# Patient Record
Sex: Female | Born: 1960 | Race: Black or African American | Hispanic: No | Marital: Married | Smoking: Never smoker
Health system: Southern US, Community
[De-identification: ages and names within clinical notes are randomized; demographics above are authoritative.]

## PROBLEM LIST (undated history)

## (undated) DIAGNOSIS — I10 Essential (primary) hypertension: Secondary | ICD-10-CM

## (undated) HISTORY — PX: TUBAL LIGATION: SHX77

## (undated) HISTORY — DX: Essential (primary) hypertension: I10

---

## 2004-09-04 ENCOUNTER — Ambulatory Visit (HOSPITAL_COMMUNITY): Admission: RE | Admit: 2004-09-04 | Discharge: 2004-09-04 | Payer: Self-pay | Admitting: Obstetrics

## 2008-02-11 ENCOUNTER — Ambulatory Visit (HOSPITAL_COMMUNITY): Admission: RE | Admit: 2008-02-11 | Discharge: 2008-02-11 | Payer: Self-pay | Admitting: Obstetrics

## 2008-10-03 ENCOUNTER — Ambulatory Visit (HOSPITAL_COMMUNITY): Admission: RE | Admit: 2008-10-03 | Discharge: 2008-10-03 | Payer: Self-pay | Admitting: Internal Medicine

## 2009-10-10 ENCOUNTER — Ambulatory Visit (HOSPITAL_COMMUNITY): Admission: RE | Admit: 2009-10-10 | Discharge: 2009-10-10 | Payer: Self-pay | Admitting: Internal Medicine

## 2010-08-12 ENCOUNTER — Encounter: Payer: Self-pay | Admitting: Internal Medicine

## 2011-02-02 ENCOUNTER — Emergency Department (HOSPITAL_COMMUNITY)
Admission: EM | Admit: 2011-02-02 | Discharge: 2011-02-03 | Disposition: A | Discharge status: Home / Self Care | Payer: No Typology Code available for payment source | Attending: Emergency Medicine | Admitting: Emergency Medicine

## 2011-02-02 DIAGNOSIS — I10 Essential (primary) hypertension: Secondary | ICD-10-CM | POA: Insufficient documentation

## 2011-02-02 DIAGNOSIS — R51 Headache: Secondary | ICD-10-CM | POA: Insufficient documentation

## 2011-02-03 ENCOUNTER — Emergency Department (HOSPITAL_COMMUNITY): Payer: No Typology Code available for payment source

## 2011-04-11 ENCOUNTER — Other Ambulatory Visit (HOSPITAL_COMMUNITY): Payer: Self-pay | Admitting: Internal Medicine

## 2011-04-11 DIAGNOSIS — Z1231 Encounter for screening mammogram for malignant neoplasm of breast: Secondary | ICD-10-CM

## 2011-04-30 ENCOUNTER — Ambulatory Visit (HOSPITAL_COMMUNITY)
Admission: RE | Admit: 2011-04-30 | Discharge: 2011-04-30 | Disposition: A | Discharge status: Home / Self Care | Payer: BC Managed Care – PPO | Source: Ambulatory Visit | Attending: Internal Medicine | Admitting: Internal Medicine

## 2011-04-30 DIAGNOSIS — Z1231 Encounter for screening mammogram for malignant neoplasm of breast: Secondary | ICD-10-CM | POA: Insufficient documentation

## 2013-03-25 ENCOUNTER — Encounter: Payer: BC Managed Care – PPO | Attending: Internal Medicine | Admitting: *Deleted

## 2013-03-25 ENCOUNTER — Encounter: Payer: Self-pay | Admitting: *Deleted

## 2013-03-25 DIAGNOSIS — E669 Obesity, unspecified: Secondary | ICD-10-CM | POA: Insufficient documentation

## 2013-03-25 DIAGNOSIS — E663 Overweight: Secondary | ICD-10-CM | POA: Insufficient documentation

## 2013-03-25 DIAGNOSIS — Z713 Dietary counseling and surveillance: Secondary | ICD-10-CM | POA: Insufficient documentation

## 2013-03-25 NOTE — Progress Notes (Signed)
 Nutrition Therapy:  Appt start time: 0800 end time:  0900.  Assessment:  Primary concern today: Weight management. Patient expresses a desire to lose weight. She is committed to making small changes for realistic, sustained weight loss. She would like to lose 30 pounds initially (to 200 pounds), then eventually get down to 160-180 pounds. She has been making an effort to reduce portion size (eating half of meals when eating out), not eating after 7:30pm, unless it is a healthy snack, and exercise more. She has lost about 2 pounds in 2 weeks. She does admit that finding time to plan healthy meals and snacking are challenges for her. She does have a sedentary job, but reports that her coworkers are trying to get a lunch walking group together.  TANITA  BODY COMP RESULTS     BMI (kg/m^2) 34.2   Fat Mass (lbs) 105   Fat Free Mass (lbs) 126  % Fat 45.5    MEDICATIONS: Reviewed   DIETARY INTAKE:   Usual eating pattern includes 3 meals and 2-3 snacks per day.  24-hr recall:  B ( AM): Toaster streudal/Special K with almond milk OR grits, boiled egg, coffee (creamer) Snk ( AM): Yogurt  L ( PM): Salad OR Mexican/Chipotle 1/2 portions OR sandwich, yogurt, water Snk ( PM): Nuts, apple, yogurt D ( PM): Grilled chicken/hamburgers, pasta salad, vegetables (corn, broccoli, green beans, asparagus, etc), water/wine occasionally OR split entree with husband or appetizer if out Snk ( PM): Occasional, same as above Beverages: Sweet tea, water with lemon  Usual physical activity: Walks in neighborhood when not too hot 45-60 min, 3 days weekly  Estimated energy needs: 1500 calories 188 g carbohydrates 94 g protein 42 g fat  Progress Towards Goal(s):  In progress.   Nutritional Diagnosis:  [place]-3.3 Overweight/obesity As related to history of excessive energy intake.  As evidenced by BMI 34.2.    Intervention:  Nutrition counseling. We discussed strategies for weight loss, including balancing  nutrients (carbs, protein, fat), portion control, healthy snacks, and exercise.   Goals: 1. 1 pounds weight loss per week. Goal weight: 200 pounds.  2. Monitor portion size.  3. Use meal plan provided to balance nutrients at meals with adequate dietary variety.  4. Continue to make healthy choices for snacks.  5. Continue to exercise 45-60 minutes at least 3 days weekly. Work up to walking 5 days weekly.   Handouts given during visit include:  5 day 1500 calorie sample meal plan  Yellow meal plan card  Monitoring/Evaluation:  Dietary intake, exercise, and body weight in 1 month(s).

## 2013-04-25 ENCOUNTER — Encounter: Payer: BC Managed Care – PPO | Attending: Internal Medicine | Admitting: *Deleted

## 2013-04-25 ENCOUNTER — Encounter: Payer: Self-pay | Admitting: *Deleted

## 2013-04-25 DIAGNOSIS — E663 Overweight: Secondary | ICD-10-CM | POA: Insufficient documentation

## 2013-04-25 DIAGNOSIS — Z713 Dietary counseling and surveillance: Secondary | ICD-10-CM | POA: Insufficient documentation

## 2013-04-25 DIAGNOSIS — E669 Obesity, unspecified: Secondary | ICD-10-CM | POA: Insufficient documentation

## 2013-04-25 NOTE — Progress Notes (Signed)
Medical Nutrition Therapy:  Appt start time: 0830 end time:  0900.  Assessment:  Patient returns for a follow up visit for weight management. She reports that she has gotten off track with her diet and exercise. She states that she has a hard time with portion control and snacking on unhealthy foods (i.e. Sweets). She acknowledges that she has to work on changing her mindset about food. She has not been walking due to poor weather conditions.   MEDICATIONS: Reviewed   DIETARY INTAKE:   Usual eating pattern includes 3 meals and 2-3 snacks per day.  24-hr recall:  B ( AM): Special K with almond milk OR grits, boiled egg, coffee (creamer)  Snk ( AM): Yogurt  L ( PM): Salad OR Mexican/Chipotle 1/2 portions OR sandwich, yogurt, water  Snk ( PM): Nuts, apple, yogurt  D ( PM): Grilled chicken/hamburgers, pasta salad, vegetables (corn, broccoli, green beans, asparagus, etc), water/wine occasionally OR split entree with husband or appetizer if out  Snk ( PM): Occasional, same as above  Beverages: Water with lemon  Usual physical activity: None, but will walk 45-60 minutes when the weather is nice.   Estimated energy needs: 1500 calories  188 g carbohydrates  94 g protein  42 g fat  Progress Towards Goal(s):  Some progress.   Nutritional Diagnosis:  [place]-3.3 Overweight/obesity As related to history of excessive energy intake. As evidenced by BMI 34.5.    Intervention:  Nutrition counseling. We reviewed strategies for weight loss, including meal planning, portion control, healthy snacks, and exercise. We also discussed using intuitive eating practices to manage intake.   Goals: 1 1 pound weight loss per week. Goal weight by next visit: 226 pounds.  2. Use plate method to balance nutrients at meals/increase vegetable intake.  3. Include 2-3 healthy snacks daily if hungry between meals.  4. Utilize intuitive eating practices to determine when to eat/stop eating.  5. Continue to practice portion  control.  6. Walk at least 30-45 minutes 3-5 days weekly.   Monitoring/Evaluation:  Dietary intake, exercise, and body weight in 2 month(s).

## 2013-06-30 ENCOUNTER — Ambulatory Visit: Payer: BC Managed Care – PPO | Admitting: *Deleted

## 2013-07-29 ENCOUNTER — Encounter (INDEPENDENT_AMBULATORY_CARE_PROVIDER_SITE_OTHER): Payer: Self-pay

## 2013-07-29 ENCOUNTER — Encounter: Payer: BC Managed Care – PPO | Attending: Internal Medicine | Admitting: *Deleted

## 2013-07-29 ENCOUNTER — Encounter: Payer: Self-pay | Admitting: *Deleted

## 2013-07-29 DIAGNOSIS — E669 Obesity, unspecified: Secondary | ICD-10-CM | POA: Insufficient documentation

## 2013-07-29 DIAGNOSIS — Z713 Dietary counseling and surveillance: Secondary | ICD-10-CM | POA: Insufficient documentation

## 2013-07-29 DIAGNOSIS — Z6830 Body mass index (BMI) 30.0-30.9, adult: Secondary | ICD-10-CM | POA: Insufficient documentation

## 2013-07-29 NOTE — Progress Notes (Signed)
Medical Nutrition Therapy:  Appt start time: 0845 end time:  0915.  Assessment:  Patient here today for a weight management follow-up. She reports that the eating healthy during the holidays was a challenge for her. However, she reports showing restraint eating sweets brought in to work and eating fruits instead. She also limited portions at holiday meals to 1 plate only. Finally, she had small portions of sweets. She was able to maintain her weight during the holidays. She also reports that normal daily intake is improved. She has been drinking water with lemon all day long, which has helped her limit her snacking. She now chooses healthy snacks (yogurt, grapefruit/citrus fruit, nuts). She has also been using the plate method to limit portion size. She is also [facility], and plans on going with her husband (who she reports is more motivated and disciplined when it comes to exercise) 3-4 days weekly for 45-60 minutes. She is encouraged by her ability to maintain her weight and resist temptation during the holidays  MEDICATIONS: Reviewed   DIETARY INTAKE:   Usual eating pattern includes 3 meals and 1-2 snacks per day.  24-hr recall:  B ( AM): Special K with almond milk OR grits, boiled egg, coffee (creamer)  Snk ( AM): Yogurt  L ( PM): Salad OR Mexican/Chipotle 1/2 portions OR sandwich, yogurt, water  Snk ( PM): Nuts, apple, yogurt  D ( PM): Grilled chicken/hamburgers, pasta salad, vegetables (corn, broccoli, green beans, asparagus, etc), water/wine occasionally OR split entree with husband or appetizer if out  Snk ( PM): Rarely Beverages: Water with lemon  Usual physical activity: None currently  Estimated energy needs: 1500 calories  188 g carbohydrates  94 g protein  42 g fat  Progress Towards Goal(s):  Some progress.   Nutritional Diagnosis:  Overweight/obesity As related to history of excessive energy intake. As evidenced by  BMI >30.  Intervention:  Nutrition counseling. We reviewed strategies for weight loss, including meal planning, portion control, healthy snacks, and exercise. Patient's progress was praised, and she was encouraged to continue her efforts.   Goals:  1 1 pound weight loss per week.  2. Continue to use plate method to balance nutrients at meals/increase vegetable intake.  3. Include 2-3 healthy snacks daily if hungry between meals.  4. Continue to utilize intuitive eating practices to determine when to eat/stop eating.  5. Continue to practice portion control.  6. Exercise at least 30-45 minutes 3-5 days weekly.   Monitoring/Evaluation:  Dietary intake, exercise, and body weight prn.

## 2015-03-28 ENCOUNTER — Other Ambulatory Visit: Payer: Self-pay | Admitting: Obstetrics and Gynecology

## 2015-03-28 DIAGNOSIS — R928 Other abnormal and inconclusive findings on diagnostic imaging of breast: Secondary | ICD-10-CM

## 2015-04-03 ENCOUNTER — Ambulatory Visit
Admission: RE | Admit: 2015-04-03 | Discharge: 2015-04-03 | Disposition: A | Discharge status: Home / Self Care | Payer: BLUE CROSS/BLUE SHIELD | Source: Ambulatory Visit | Attending: Obstetrics and Gynecology | Admitting: Obstetrics and Gynecology

## 2015-04-03 ENCOUNTER — Ambulatory Visit
Admission: RE | Admit: 2015-04-03 | Discharge: 2015-04-03 | Disposition: A | Discharge status: Home / Self Care | Source: Ambulatory Visit | Attending: Obstetrics and Gynecology | Admitting: Obstetrics and Gynecology

## 2015-04-03 DIAGNOSIS — R928 Other abnormal and inconclusive findings on diagnostic imaging of breast: Secondary | ICD-10-CM

## 2020-02-28 ENCOUNTER — Other Ambulatory Visit: Payer: Self-pay | Admitting: Obstetrics and Gynecology

## 2020-02-28 DIAGNOSIS — R928 Other abnormal and inconclusive findings on diagnostic imaging of breast: Secondary | ICD-10-CM

## 2020-03-09 ENCOUNTER — Other Ambulatory Visit: Payer: Self-pay

## 2020-03-09 ENCOUNTER — Ambulatory Visit
Admission: RE | Admit: 2020-03-09 | Discharge: 2020-03-09 | Disposition: A | Discharge status: Home / Self Care | Payer: BLUE CROSS/BLUE SHIELD | Source: Ambulatory Visit | Attending: Obstetrics and Gynecology | Admitting: Obstetrics and Gynecology

## 2020-03-09 ENCOUNTER — Ambulatory Visit
Admission: RE | Admit: 2020-03-09 | Discharge: 2020-03-09 | Disposition: A | Discharge status: Home / Self Care | Payer: No Typology Code available for payment source | Source: Ambulatory Visit | Attending: Obstetrics and Gynecology | Admitting: Obstetrics and Gynecology

## 2020-03-09 DIAGNOSIS — R928 Other abnormal and inconclusive findings on diagnostic imaging of breast: Secondary | ICD-10-CM

## 2021-01-18 IMAGING — MG MM DIGITAL DIAGNOSTIC UNILAT*R* W/ TOMO W/ CAD
4 series · 4 of 12 positions shown · non-contrast
Comparison: Previous exam(s).

CLINICAL DATA: Patient was recalled from screening mammogram for a
possible right breast asymmetry

EXAM:
DIGITAL DIAGNOSTIC RIGHT MAMMOGRAM WITH CAD AND TOMO
ULTRASOUND RIGHT BREAST

[R ML synth-2D]
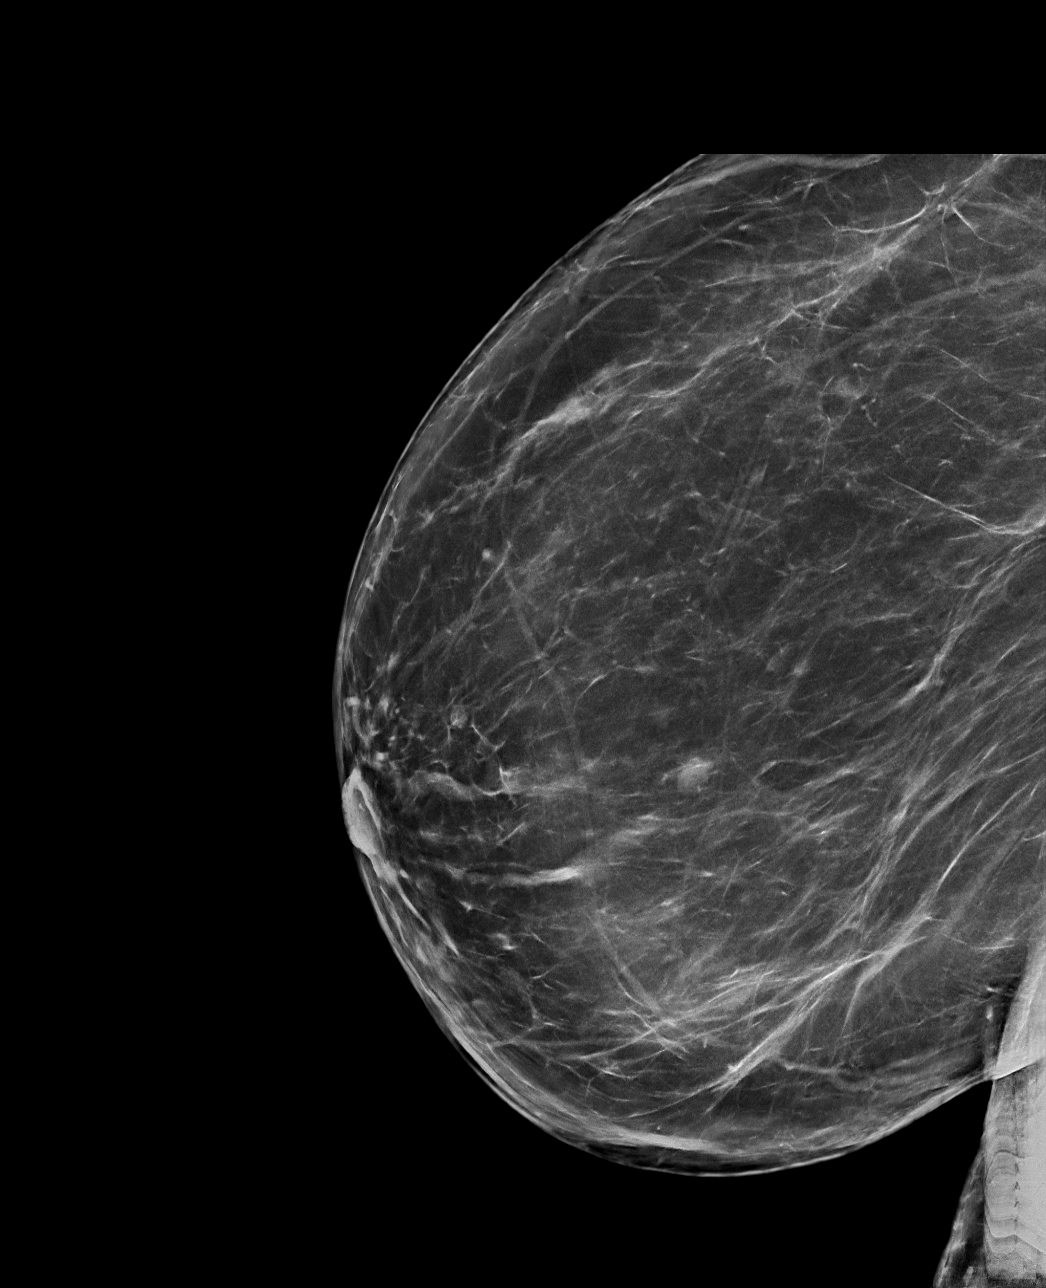

[R MLO synth-2D]
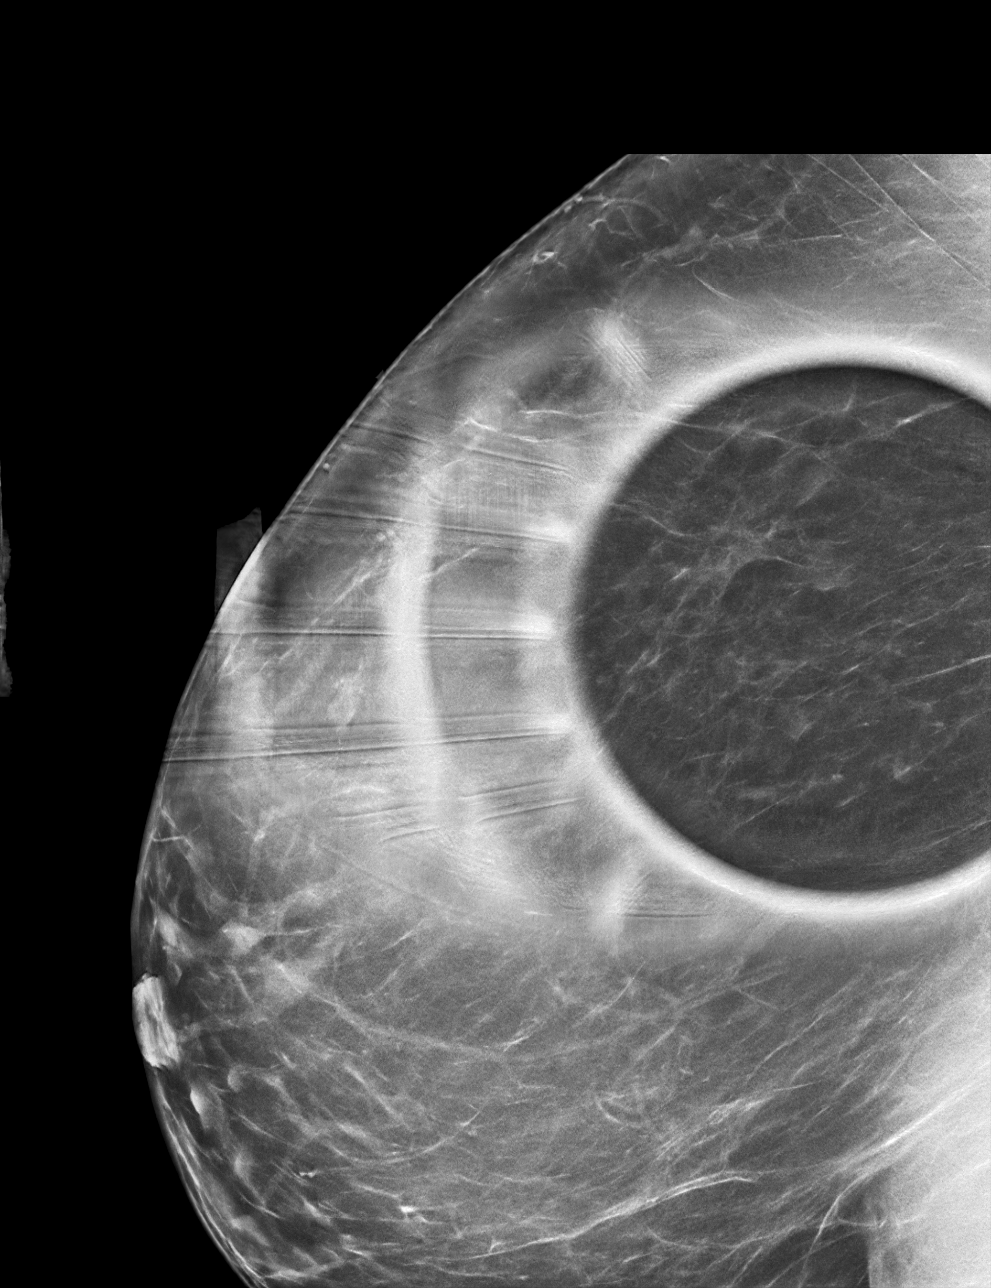

[R ML tomo · tomo slice 45/89.0]
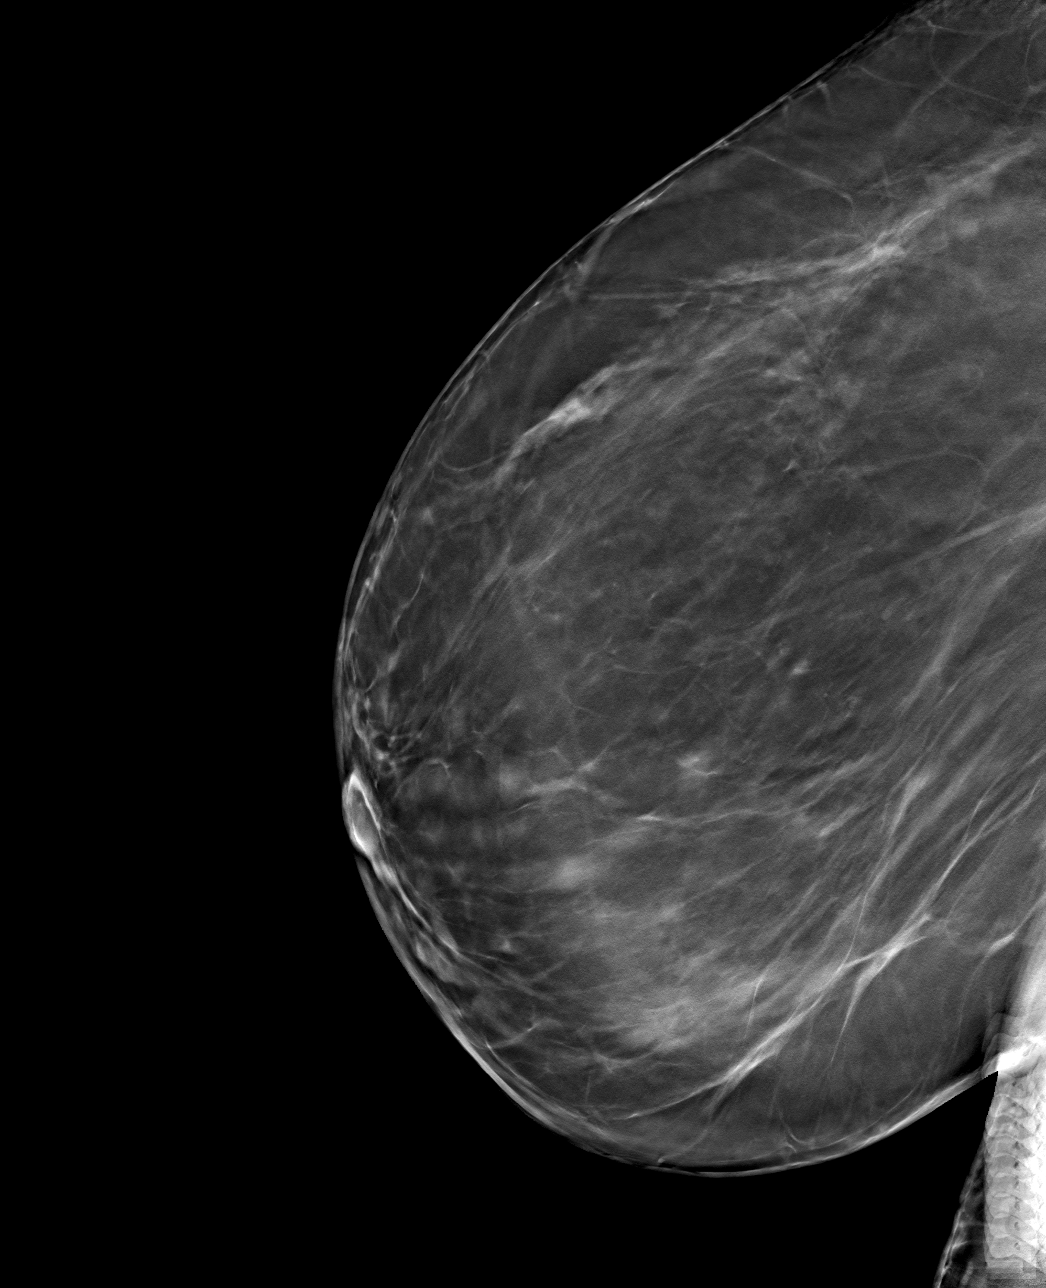

[R MLO tomo · tomo slice 41/82.0]
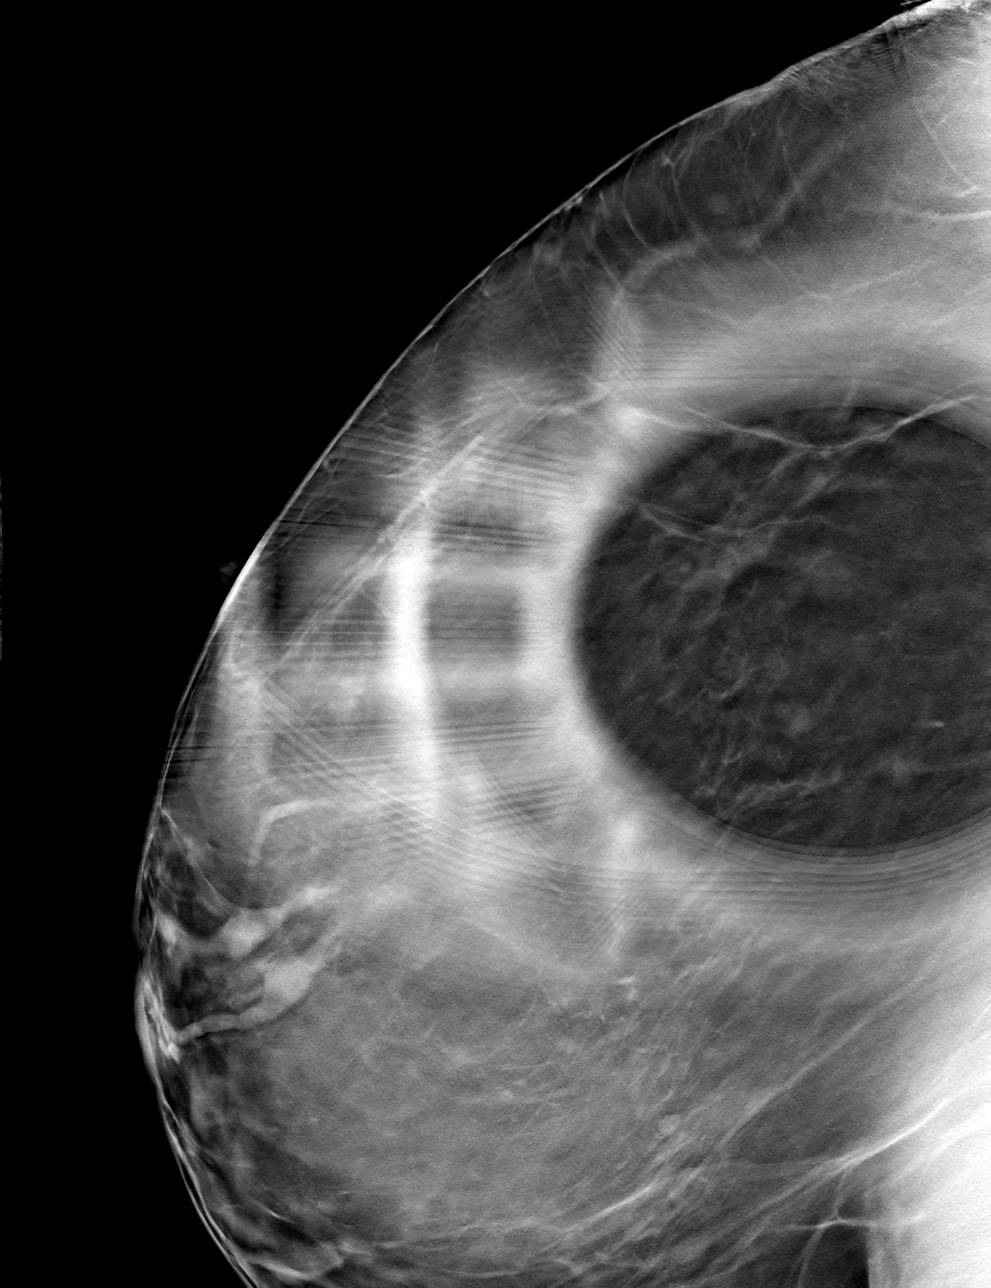

[4 of 12 positions shown; findings below may reference images not displayed]

ACR Breast Density Category b: There are scattered areas of
fibroglandular density.
FINDINGS: Additional imaging of the right breast was performed. There is
persistence of a low-density asymmetry in the upper-outer quadrant
of the breast. There are no malignant type microcalcifications.

Mammographic images were processed with CAD.

Targeted ultrasound is performed, showing a benign intramammary
lymph node in the right breast at [DATE] 10 cm from the nipple
measuring 8 mm.
IMPRESSION: No evidence of malignancy in the right breast.

RECOMMENDATION:
Bilateral screening mammogram in 1 year is recommended.

I have discussed the findings and recommendations with the patient.
If applicable, a reminder letter will be sent to the patient
regarding the next appointment.

BI-RADS CATEGORY  2: Benign.
